# Patient Record
Sex: Male | Born: 1990 | Race: Black or African American | Hispanic: No | Marital: Single | Smoking: Never smoker
Health system: Southern US, Community
[De-identification: ages and names within clinical notes are randomized; demographics above are authoritative.]

## PROBLEM LIST (undated history)

## (undated) DIAGNOSIS — J302 Other seasonal allergic rhinitis: Secondary | ICD-10-CM

## (undated) DIAGNOSIS — I82409 Acute embolism and thrombosis of unspecified deep veins of unspecified lower extremity: Secondary | ICD-10-CM

## (undated) DIAGNOSIS — S93402A Sprain of unspecified ligament of left ankle, initial encounter: Secondary | ICD-10-CM

## (undated) HISTORY — DX: Acute embolism and thrombosis of unspecified deep veins of unspecified lower extremity: I82.409

## (undated) HISTORY — PX: APPENDECTOMY: SHX54

---

## 2012-01-17 ENCOUNTER — Emergency Department (INDEPENDENT_AMBULATORY_CARE_PROVIDER_SITE_OTHER)
Admission: EM | Admit: 2012-01-17 | Discharge: 2012-01-17 | Disposition: A | Discharge status: Home / Self Care | Payer: Self-pay | Source: Home / Self Care | Attending: Emergency Medicine | Admitting: Emergency Medicine

## 2012-01-17 ENCOUNTER — Emergency Department (INDEPENDENT_AMBULATORY_CARE_PROVIDER_SITE_OTHER): Payer: Self-pay

## 2012-01-17 ENCOUNTER — Ambulatory Visit: Payer: Self-pay

## 2012-01-17 ENCOUNTER — Encounter (HOSPITAL_COMMUNITY): Payer: Self-pay | Admitting: *Deleted

## 2012-01-17 DIAGNOSIS — S93409A Sprain of unspecified ligament of unspecified ankle, initial encounter: Secondary | ICD-10-CM

## 2012-01-17 DIAGNOSIS — S93402A Sprain of unspecified ligament of left ankle, initial encounter: Secondary | ICD-10-CM

## 2012-01-17 HISTORY — DX: Other seasonal allergic rhinitis: J30.2

## 2012-01-17 HISTORY — DX: Sprain of unspecified ligament of left ankle, initial encounter: S93.402A

## 2012-01-17 MED ORDER — IBUPROFEN 800 MG PO TABS: 800.0000 mg | ORAL_TABLET | Freq: Three times a day (TID) | ORAL | Status: AC | PRN

## 2012-01-17 MED ORDER — HYDROCODONE-ACETAMINOPHEN 5-325 MG PO TABS: 2.0000 | ORAL_TABLET | Freq: Four times a day (QID) | ORAL | Status: AC | PRN

## 2012-01-17 NOTE — ED Notes (Signed)
Pt  Reports  2  Days  Ago  He  Was   Playing   Basketball     X  2  Days  Ago     And  Injured   His  l  Ankle       He  Was   Jumping  And  Landed  Twisting his  l  Ankle  He  Heard a  Pop    -  He  Has  Pain and  Swelling  Evident    He  Is  Able  To bear  Weight     But  Has  Some  Pain when he  Does  So

## 2012-01-17 NOTE — ED Provider Notes (Signed)
History     CSN: [id number]  Arrival date & time 01/17/12  1604   First MD Initiated Contact with Patient 01/17/12 1608      Chief Complaint  Patient presents with  . Ankle Pain    (Consider location/radiation/quality/duration/timing/severity/associated sxs/prior treatment) HPI Comments: Patient states he rolled his left ankle out words while playing basketball several days ago. Heard a "pop", with immediate swelling. Patient's was able to walk on it immediately after, but reports significant pain with weightbearing. Now has swelling lateral aspect of ankle, and bruising underneath  lateral malleolus. Patient has history of repeated left ankle sprains. Has been taking ibuprofen, ice, elevation with significant improvement in swelling and pain.  ROS as noted in HPI. All other ROS negative.   Patient is a 21 y.o. male presenting with ankle pain. The history is provided by the patient. No language interpreter was used.  Ankle Pain  The incident occurred 2 days ago. [place]. The injury mechanism was a fall. The pain is present in the left ankle. The quality of the pain is described as aching. The pain has been constant since onset. Associated symptoms include inability to bear weight. Pertinent negatives include no numbness, no loss of motion, no muscle weakness, no loss of sensation and no tingling. He reports no foreign bodies present. The symptoms are aggravated by activity, bearing weight and palpation. He has tried NSAIDs and ice (800 mg ibuprofen) for the symptoms. The treatment provided significant relief.    Past Medical History  Diagnosis Date  . Left ankle sprain   . Seasonal allergies     History reviewed. No pertinent past surgical history.  History reviewed. No pertinent family history.  History  Substance Use Topics  . Smoking status: Never Smoker   . Smokeless tobacco: Not on file  . Alcohol Use: No      Review of Systems  Neurological:  Negative for tingling and numbness.    Allergies  Review of patient's allergies indicates no known allergies.  Home Medications   Current Outpatient Rx  Name Route Sig Dispense Refill  . HYDROCODONE-ACETAMINOPHEN 5-325 MG PO TABS Oral Take 2 tablets by mouth every 6 (six) hours as needed for pain. 20 tablet 0  . IBUPROFEN 800 MG PO TABS Oral Take 1 tablet (800 mg total) by mouth every 8 (eight) hours as needed for pain or fever. 20 tablet 0    BP 133/78  Pulse 55  Temp(Src) 98.4 F (36.9 C) (Oral)  Resp 17  SpO2 97%  Physical Exam  Nursing note and vitals reviewed. Constitutional: He is oriented to person, place, and time. He appears well-developed and well-nourished.  HENT:  Head: Normocephalic and atraumatic.  Eyes: Conjunctivae and EOM are normal.  Neck: Normal range of motion.  Cardiovascular: Normal rate.   Pulmonary/Chest: Effort normal. No respiratory distress.  Abdominal: He exhibits no distension.  Musculoskeletal: Normal range of motion.       Left ankle: tenderness. AITFL tenderness found.       Feet:       Soft tissue swelling, Bruising underneath lateral malleolus see drawing. Ankle Tenderness entire joint, Distal fibula NT, Medial malleolus NT ,  Deltoid ligament NT, Lateral ligaments tender, Achilles NT, Proximal fibula NT, Proximal 5th metatarsal NT, Midfoot NT, distal NVI with baseline sensation / motor to foot with CR<2 seconds.  Neurological: He is alert and oriented to person, place, and time.  Skin: Skin is warm and dry.  Psychiatric:  He has a normal mood and affect. His behavior is normal.    ED Course  Procedures (including critical care time)  Labs Reviewed - No data to display Dg Ankle Complete Left  01/17/2012  *RADIOLOGY REPORT*  Clinical Data: Left ankle injury.  LEFT ANKLE COMPLETE - 3+ VIEW  Comparison: None.  Findings: No acute fracture or dislocation identified.  Soft tissue swelling is seen, primarily overlying the lateral malleolus.   Ankle mortise shows normal alignment.  IMPRESSION: Lateral soft tissue swelling.  No acute fracture identified.  Original Report Authenticated By: Reola Calkins, M.D.     1. Ankle sprain, left, initial encounter       MDM  X-ray reviewed by myself. No fracture. Full report per radiologist. Advised continued ice, rest, elevation. Discussed w/ patient that this will take 4-6 weeks to fully heal. Applied ASO, crutches WBAT, instructed pt on ice, nsaid/ norco prn, and f/u with Dr. Rennis Chris ortho on call in 10 days if no improvement.   Luiz Blare, MD 01/17/12 [phone]

## 2012-01-17 NOTE — Discharge Instructions (Signed)
Take the medication as written. Take 1 gram of tylenol with the motrin up to 4 times a day as needed for pain and fever. This is an effective combination for pain. Take the hydrocodone/norco/percocet only for severe pain. Do not take the tylenol and hydrocodone/norco/percocet as they both have tylenol in them and too much can hurt your liver. Return if you get worse, have a  fever >100.4, or for any concerns.   Go to www.goodrx.com to look up your medications. This will give you a list of where you can find your prescriptions at the most affordable prices.

## 2012-10-07 IMAGING — CR DG ANKLE COMPLETE 3+V*L*
3 series · 3 of 3 positions shown · non-contrast
Comparison: None.

CLINICAL DATA: Left ankle injury.

LEFT ANKLE COMPLETE - 3+ VIEW

[view not recorded (1 of 3)]
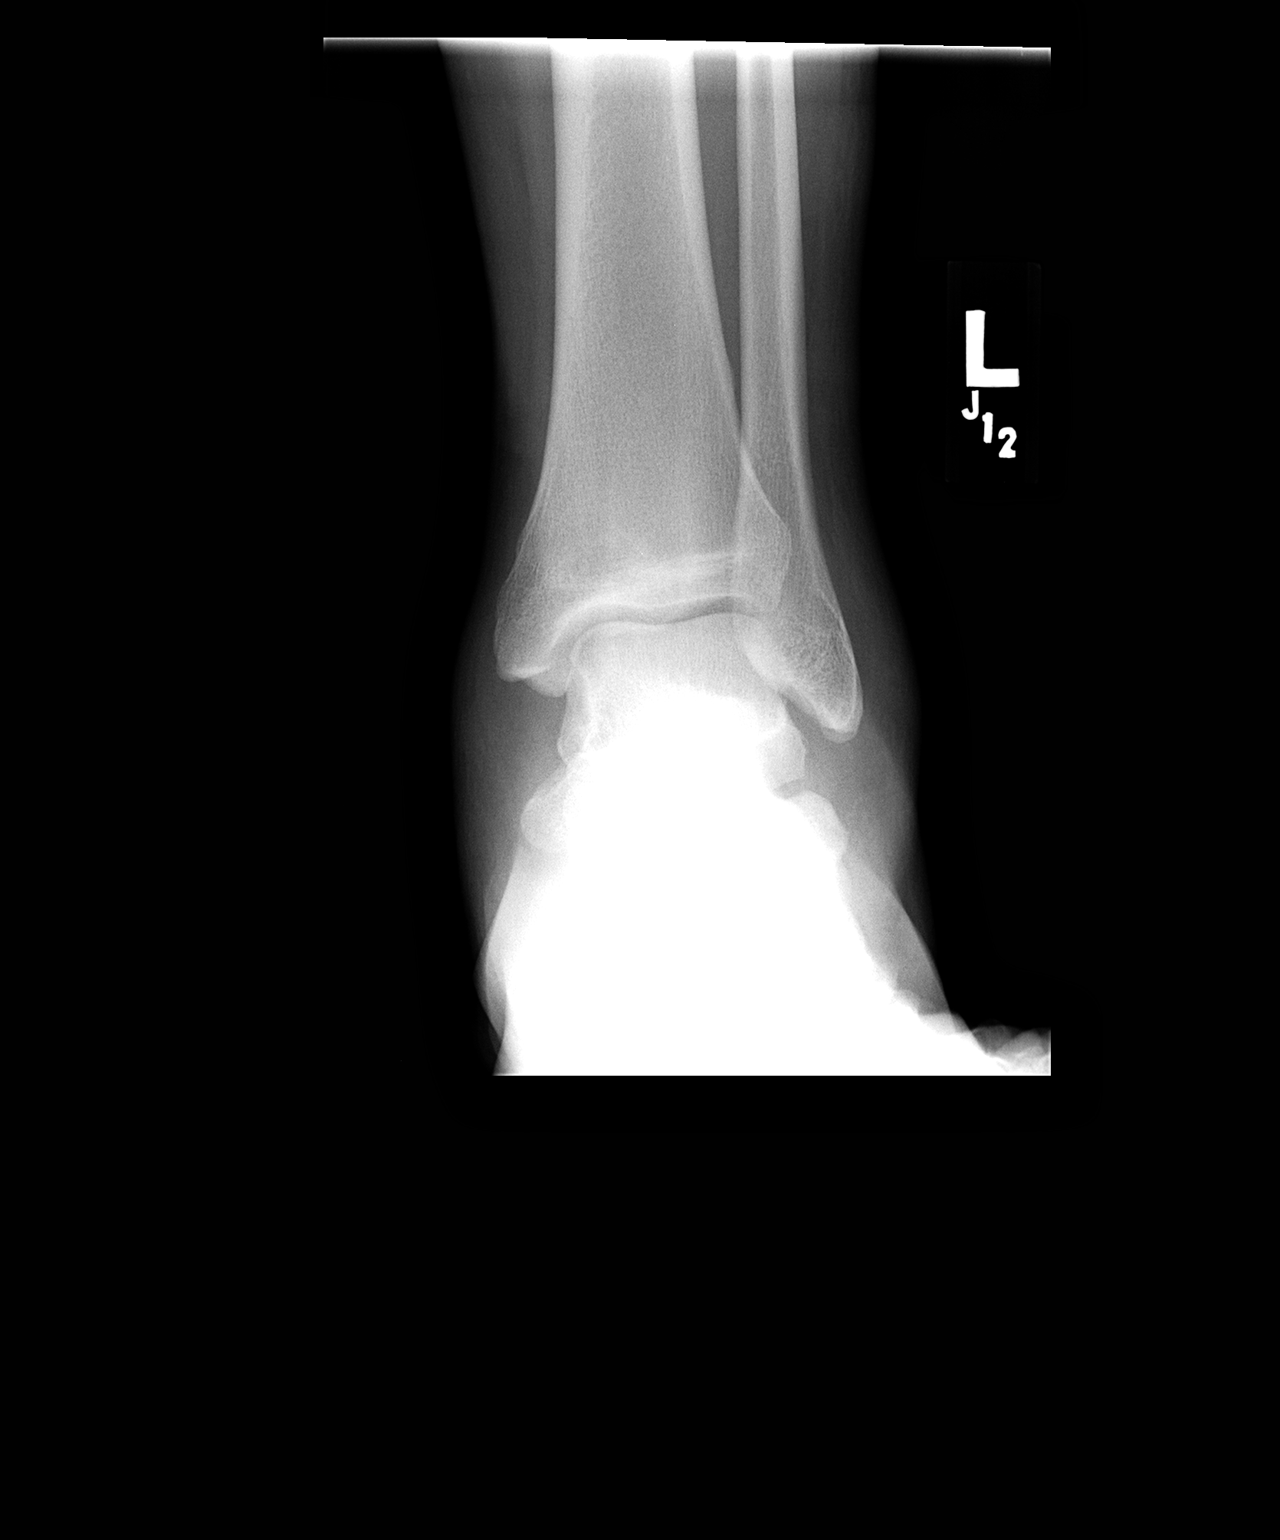

[view not recorded (2 of 3)]
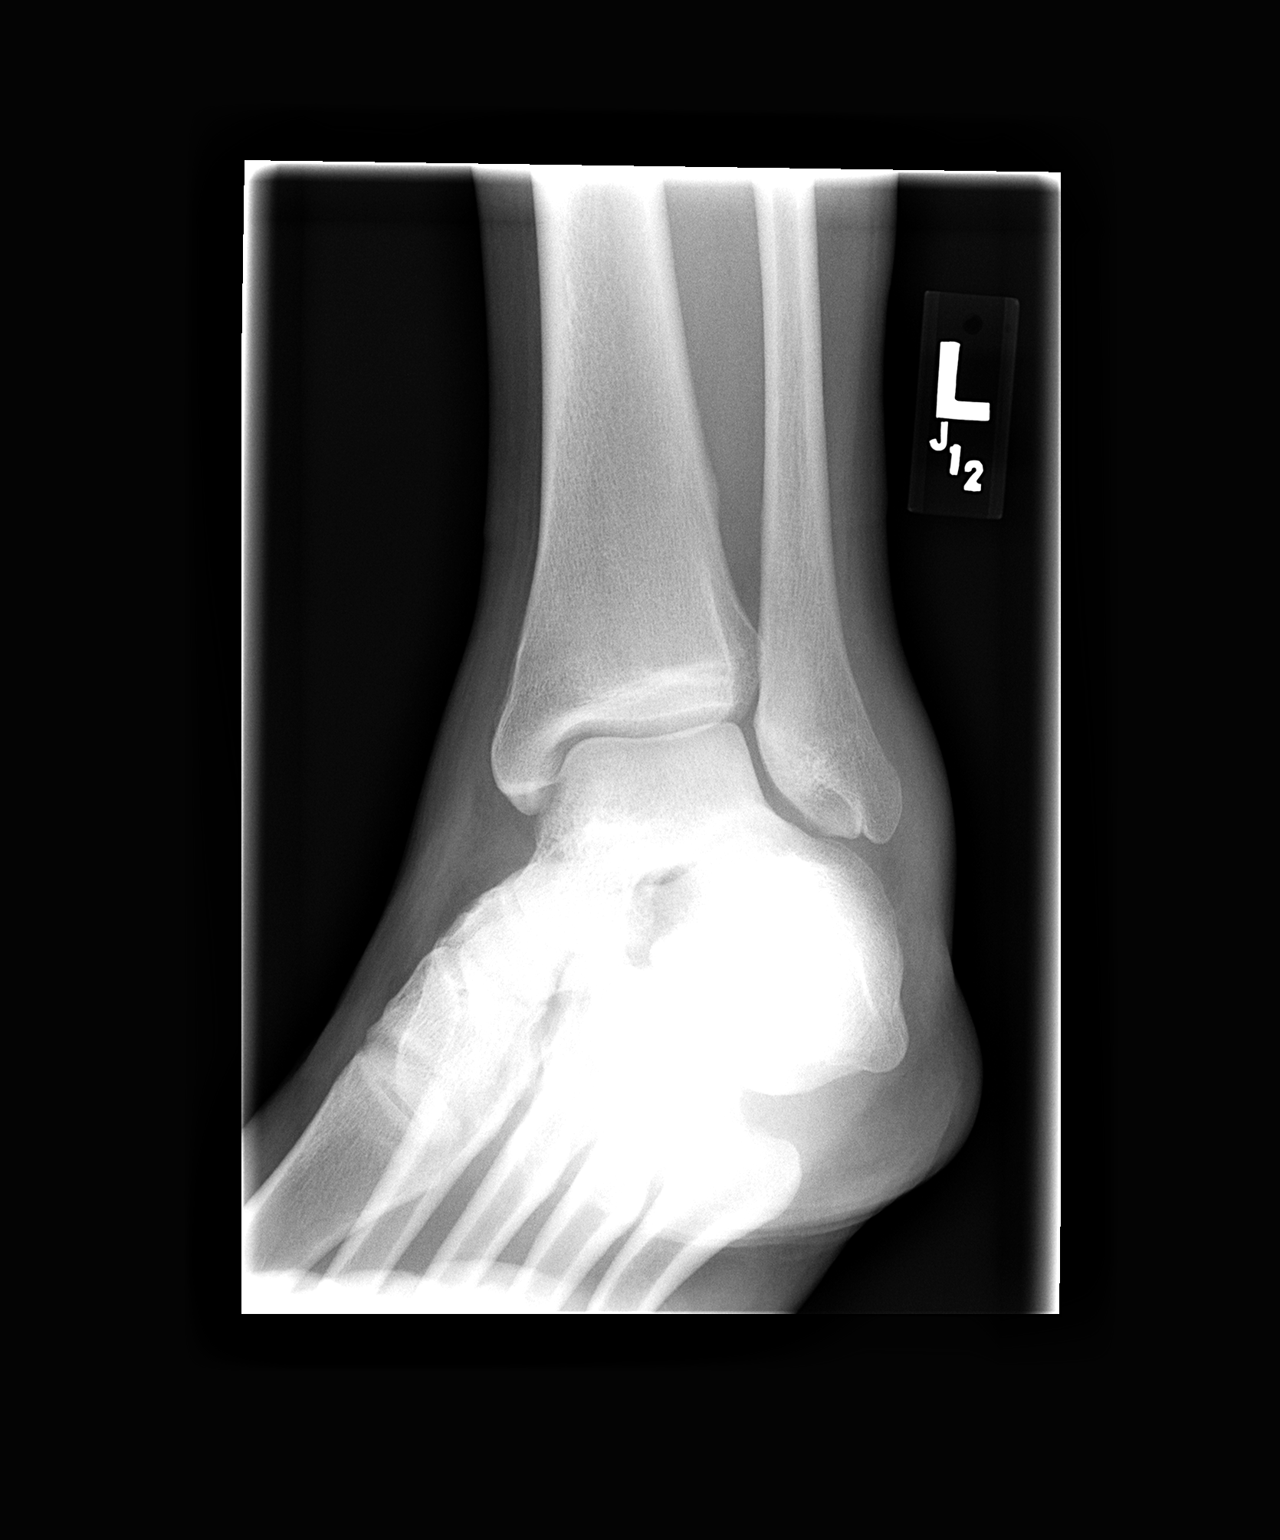

[view not recorded (3 of 3)]
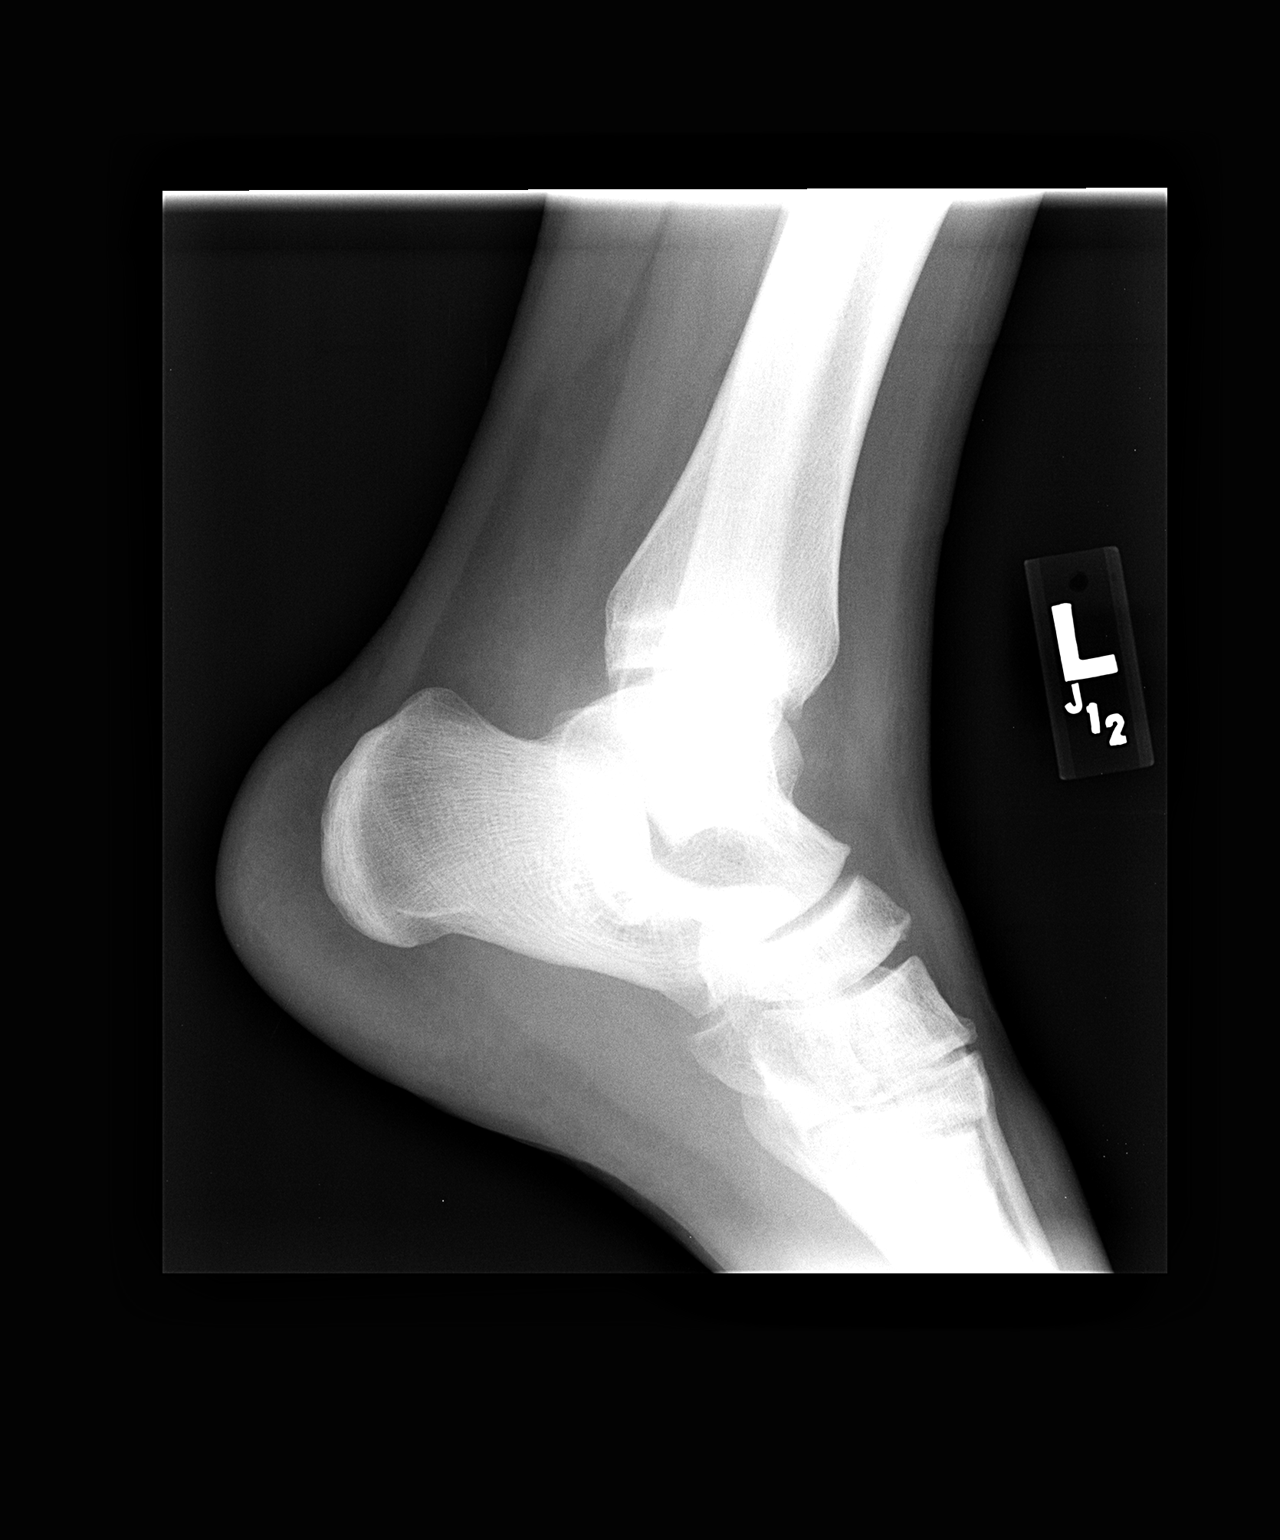

[3 of 3 positions shown; findings below may reference images not displayed]

FINDINGS: No acute fracture or dislocation identified.  Soft tissue
swelling is seen, primarily overlying the lateral malleolus.  Ankle
mortise shows normal alignment.
IMPRESSION: Lateral soft tissue swelling.  No acute fracture identified.

## 2020-05-05 ENCOUNTER — Other Ambulatory Visit: Payer: Self-pay

## 2020-05-05 DIAGNOSIS — Z20822 Contact with and (suspected) exposure to covid-19: Secondary | ICD-10-CM

## 2020-05-06 LAB — NOVEL CORONAVIRUS, NAA: SARS-CoV-2, NAA: NOT DETECTED

## 2020-05-06 LAB — SARS-COV-2, NAA 2 DAY TAT

## 2020-08-26 ENCOUNTER — Other Ambulatory Visit: Payer: Self-pay

## 2020-08-26 DIAGNOSIS — Z20822 Contact with and (suspected) exposure to covid-19: Secondary | ICD-10-CM

## 2020-08-28 LAB — SARS-COV-2, NAA 2 DAY TAT

## 2020-08-28 LAB — NOVEL CORONAVIRUS, NAA: SARS-CoV-2, NAA: NOT DETECTED

## 2020-08-28 LAB — SPECIMEN STATUS REPORT

## 2022-11-20 ENCOUNTER — Telehealth: Payer: Self-pay

## 2022-11-20 NOTE — Telephone Encounter (Signed)
Mychart msg sent

## 2024-04-03 ENCOUNTER — Other Ambulatory Visit (HOSPITAL_COMMUNITY): Payer: Self-pay

## 2024-04-03 ENCOUNTER — Other Ambulatory Visit: Payer: Self-pay

## 2024-04-03 ENCOUNTER — Emergency Department (HOSPITAL_COMMUNITY)
Admission: EM | Admit: 2024-04-03 | Discharge: 2024-04-03 | Disposition: A | Discharge status: Home / Self Care | Attending: Emergency Medicine | Admitting: Emergency Medicine

## 2024-04-03 ENCOUNTER — Emergency Department (HOSPITAL_COMMUNITY)

## 2024-04-03 DIAGNOSIS — M7989 Other specified soft tissue disorders: Secondary | ICD-10-CM | POA: Diagnosis not present

## 2024-04-03 DIAGNOSIS — I82462 Acute embolism and thrombosis of left calf muscular vein: Secondary | ICD-10-CM | POA: Diagnosis not present

## 2024-04-03 DIAGNOSIS — Z7901 Long term (current) use of anticoagulants: Secondary | ICD-10-CM | POA: Diagnosis not present

## 2024-04-03 DIAGNOSIS — M79662 Pain in left lower leg: Secondary | ICD-10-CM | POA: Diagnosis present

## 2024-04-03 DIAGNOSIS — I82402 Acute embolism and thrombosis of unspecified deep veins of left lower extremity: Secondary | ICD-10-CM | POA: Diagnosis not present

## 2024-04-03 LAB — I-STAT CHEM 8, ED
BUN: 15 mg/dL (ref 6–20)
Calcium, Ion: 1.15 mmol/L (ref 1.15–1.40)
Chloride: 104 mmol/L (ref 98–111)
Creatinine, Ser: 0.9 mg/dL (ref 0.61–1.24)
Glucose, Bld: 86 mg/dL (ref 70–99)
HCT: 42 % (ref 39.0–52.0)
Hemoglobin: 14.3 g/dL (ref 13.0–17.0)
Potassium: 3.9 mmol/L (ref 3.5–5.1)
Sodium: 138 mmol/L (ref 135–145)
TCO2: 24 mmol/L (ref 22–32)

## 2024-04-03 MED ORDER — APIXABAN (ELIQUIS) VTE STARTER PACK (10MG AND 5MG)
ORAL_TABLET | ORAL | 0 refills | Status: DC
  Filled 2024-04-03: qty 74, 30d supply, fill #0

## 2024-04-03 MED ORDER — APIXABAN 5 MG PO TABS: 5.0000 mg | ORAL_TABLET | Freq: Two times a day (BID) | ORAL | Status: DC

## 2024-04-03 MED ORDER — APIXABAN 5 MG PO TABS
10.0000 mg | ORAL_TABLET | Freq: Two times a day (BID) | ORAL | Status: DC
  Administered 2024-04-03: 10 mg via ORAL
  Filled 2024-04-03: qty 2

## 2024-04-03 NOTE — Discharge Instructions (Signed)
 Follow up with a family doc and the blood doctors in the office.   Please return for chest pain difficulty breathing or if you pass out or feel you are going to pass out.

## 2024-04-03 NOTE — ED Triage Notes (Signed)
 Pt. Stated, I stareted having pain in left leg especially behind the knee and the front of knee. Its to the point Im unable to walk on it.  No injuries

## 2024-04-03 NOTE — Progress Notes (Signed)
 LLE venous duplex has been completed.  Preliminary results given to Dr. Garrick.   Results can be found under chart review under CV PROC. 04/03/2024 12:07 PM Jada Kuhnert RVT, RDMS

## 2024-04-03 NOTE — ED Provider Notes (Signed)
 [place] EMERGENCY DEPARTMENT AT [facility] Provider Note   CSN: [id number] Arrival date & time: 04/03/24  9275     Patient presents with: Knee Pain   Alexander Lang is a 33 y.o. male.   33 yo M with a cc of left calf pain.  Going on for a few days now.  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  Denies injury to the area.  He denies chest pain difficulty breathing.  Denies history of blood clot in the past.  Denies recent surgery immobilization or hospitalization.   Knee Pain      Prior to Admission medications   Medication Sig Start Date End Date Taking? Authorizing Provider  APIXABAN  (ELIQUIS ) VTE STARTER PACK (10MG  AND 5MG ) Take as directed on package: start with two-5mg  tablets twice daily for 7 days. On day 8, switch to one-5mg  tablet twice daily. 04/03/24  Yes Emil Share, DO    Allergies: Sulfa antibiotics    Review of Systems  Updated Vital Signs BP 129/89 (BP Location: Right Arm)   Pulse 67   Temp 98.4 F (36.9 C)   Resp 17   Ht 6' 3 (1.905 m)   Wt 111.1 kg   SpO2 99%   BMI 30.62 kg/m   Physical Exam Vitals and nursing note reviewed.  Constitutional:      Appearance: He is well-developed.  HENT:     Head: Normocephalic and atraumatic.  Eyes:     Pupils: Pupils are equal, round, and reactive to light.  Neck:     Vascular: No JVD.  Cardiovascular:     Rate and Rhythm: Normal rate and regular rhythm.     Heart sounds: No murmur heard.    No friction rub. No gallop.  Pulmonary:     Effort: No respiratory distress.     Breath sounds: No wheezing.  Abdominal:     General: There is no distension.     Tenderness: There is no abdominal tenderness. There is no guarding or rebound.  Musculoskeletal:        General: Normal range of motion.     Cervical back: Normal range of motion and neck supple.     Comments: Pain and swelling to the left calf.  Pulse motor and sensation intact distally.  Skin:    Coloration:  Skin is not pale.     Findings: No rash.  Neurological:     Mental Status: He is alert and oriented to person, place, and time.  Psychiatric:        Behavior: Behavior normal.     (all labs ordered are listed, but only abnormal results are displayed) Labs Reviewed  I-STAT CHEM 8, ED    EKG: None  Radiology: VAS US  LOWER EXTREMITY VENOUS (DVT) (ONLY [facility] & [facility]) Result Date: 04/03/2024  Lower Venous DVT Study Patient Name:  Alexander Lang [facility]  Date of Exam:   04/03/2024 Medical Rec #: [id number]      Accession #:    [id number] Date of Birth: May 19, 1991     Patient Gender: M Patient Age:   89 years Exam Location:  [facility] Procedure:      VAS US  LOWER EXTREMITY VENOUS (DVT) Referring Phys: LAMAR LOCKWOOD --------------------------------------------------------------------------------  Indications: Pain, and Swelling.  Risk Factors: Frequent extended travel for work. Comparison Study: No previous exams Performing Technologist: Jody Hill RVT, RDMS  Examination Guidelines: A complete evaluation includes B-mode imaging, spectral Doppler, color Doppler, and  power Doppler as needed of all accessible portions of each vessel. Bilateral testing is considered an integral part of a complete examination. Limited examinations for reoccurring indications may be performed as noted. The reflux portion of the exam is performed with the patient in reverse Trendelenburg.  +-----+---------------+---------+-----------+----------+--------------+ RIGHTCompressibilityPhasicitySpontaneityPropertiesThrombus Aging +-----+---------------+---------+-----------+----------+--------------+ CFV  Full           Yes      Yes                                 +-----+---------------+---------+-----------+----------+--------------+   +---------+---------------+---------+-----------+----------+-----------------+ LEFT     CompressibilityPhasicitySpontaneityPropertiesThrombus Aging     +---------+---------------+---------+-----------+----------+-----------------+ CFV      Full           Yes      Yes                                    +---------+---------------+---------+-----------+----------+-----------------+ SFJ      Full                                                           +---------+---------------+---------+-----------+----------+-----------------+ FV Prox  Full           Yes      Yes                                    +---------+---------------+---------+-----------+----------+-----------------+ FV Mid   Full           Yes      Yes                                    +---------+---------------+---------+-----------+----------+-----------------+ FV DistalFull           Yes      Yes                                    +---------+---------------+---------+-----------+----------+-----------------+ PFV      Full                                                           +---------+---------------+---------+-----------+----------+-----------------+ POP      Partial        Yes      Yes                  Age Indeterminate +---------+---------------+---------+-----------+----------+-----------------+ PTV      Full                                                           +---------+---------------+---------+-----------+----------+-----------------+ PERO     None  No       No                   Age Indeterminate +---------+---------------+---------+-----------+----------+-----------------+ Gastroc  None           No       No         dilated   Acute             +---------+---------------+---------+-----------+----------+-----------------+   Summary: RIGHT: - No evidence of common femoral vein obstruction.   LEFT: Findings consistent with acute intramuscular thrombosis involving the left gastrocnemius veins. - Findings consistent with age indeterminate deep vein thrombosis involving the left popliteal vein, and left peroneal  veins.  - No cystic structure found in the popliteal fossa.  *See table(s) above for measurements and observations.    Preliminary      Procedures   Medications Ordered in the ED  apixaban  (ELIQUIS ) tablet 10 mg (has no administration in time range)    Followed by  apixaban  (ELIQUIS ) tablet 5 mg (has no administration in time range)                                    Medical Decision Making  33 yo M with a chief complaints of left calf pain.  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  This area anticoagulation is controversial but with signs of old DVT I will start him on anticoagulation.  Patient unfortunately has no primary care and has not been seen by doctor in some years.  Blood testing was performed to assess his renal function which has resulted and is normal.  Will start on Eliquis .  With him not having immediate follow-up will give information to follow-up with the hematologist in the clinic.  4:49 PM:  I have discussed the diagnosis/risks/treatment options with the patient.  Evaluation and diagnostic testing in the emergency department does not suggest an emergent condition requiring admission or immediate intervention beyond what has been performed at this time.  They will follow up with PCP, heme. We also discussed returning to the ED immediately if new or worsening sx occur. We discussed the sx which are most concerning (e.g., sudden worsening pain, fever, inability to tolerate by mouth, chest pain, sob, syncope) that necessitate immediate return. Medications administered to the patient during their visit and any new prescriptions provided to the patient are listed below.  Medications given during this visit Medications  apixaban  (ELIQUIS ) tablet 10 mg (has no administration in time range)    Followed by  apixaban  (ELIQUIS ) tablet 5 mg (has no administration in time range)     The patient appears reasonably screen  and/or stabilized for discharge and I doubt any other medical condition or other [facility] requiring further screening, evaluation, or treatment in the ED at this time prior to discharge.       Final diagnoses:  Acute deep vein thrombosis (DVT) of calf muscle vein of left lower extremity [facility])    ED Discharge Orders          Ordered    APIXABAN  (ELIQUIS ) VTE STARTER PACK (10MG  AND 5MG )       Note to Pharmacy: If starter pack unavailable, substitute with seventy-four 5 mg apixaban  tabs following the above SIG directions.   04/03/24 1635  Emil Share, DO 04/03/24 1649

## 2024-04-03 NOTE — ED Triage Notes (Signed)
 Complains of pain behind left knee with fluid build up.  Worse with standing and walking.  Reports left leg is more swollen than right.  Started a few days ago thinking it was a pulled muscle but has continued to worsen.

## 2024-05-01 ENCOUNTER — Telehealth: Payer: Self-pay | Admitting: Oncology

## 2024-05-01 NOTE — Telephone Encounter (Signed)
 Alexander Lang and stated that he has recently been in the hospital and was referred to see Dr. Autumn for DVT diagnosis. I do not see a referral in for [facility] from his most recent hospital visit.

## 2024-05-09 ENCOUNTER — Other Ambulatory Visit: Payer: Self-pay

## 2024-05-09 ENCOUNTER — Encounter: Payer: Self-pay | Admitting: Physician Assistant

## 2024-05-09 ENCOUNTER — Inpatient Hospital Stay: Attending: Physician Assistant | Admitting: Physician Assistant

## 2024-05-09 ENCOUNTER — Inpatient Hospital Stay

## 2024-05-09 VITALS — BP 136/89 | HR 60 | Temp 97.3°F | Resp 20 | Wt 243.7 lb

## 2024-05-09 DIAGNOSIS — I82432 Acute embolism and thrombosis of left popliteal vein: Secondary | ICD-10-CM | POA: Diagnosis not present

## 2024-05-09 DIAGNOSIS — Z7901 Long term (current) use of anticoagulants: Secondary | ICD-10-CM | POA: Insufficient documentation

## 2024-05-09 DIAGNOSIS — I82462 Acute embolism and thrombosis of left calf muscular vein: Secondary | ICD-10-CM

## 2024-05-09 DIAGNOSIS — Z86718 Personal history of other venous thrombosis and embolism: Secondary | ICD-10-CM | POA: Insufficient documentation

## 2024-05-09 DIAGNOSIS — R5383 Other fatigue: Secondary | ICD-10-CM

## 2024-05-09 DIAGNOSIS — I82452 Acute embolism and thrombosis of left peroneal vein: Secondary | ICD-10-CM | POA: Diagnosis not present

## 2024-05-09 LAB — CBC WITH DIFFERENTIAL (CANCER CENTER ONLY)
Abs Immature Granulocytes: 0.01 K/uL (ref 0.00–0.07)
Basophils Absolute: 0 K/uL (ref 0.0–0.1)
Basophils Relative: 1 %
Eosinophils Absolute: 0.1 K/uL (ref 0.0–0.5)
Eosinophils Relative: 2 %
HCT: 41 % (ref 39.0–52.0)
Hemoglobin: 13.7 g/dL (ref 13.0–17.0)
Immature Granulocytes: 0 %
Lymphocytes Relative: 32 %
Lymphs Abs: 1.6 K/uL (ref 0.7–4.0)
MCH: 30.1 pg (ref 26.0–34.0)
MCHC: 33.4 g/dL (ref 30.0–36.0)
MCV: 90.1 fL (ref 80.0–100.0)
Monocytes Absolute: 0.3 K/uL (ref 0.1–1.0)
Monocytes Relative: 7 %
Neutro Abs: 2.8 K/uL (ref 1.7–7.7)
Neutrophils Relative %: 58 %
Platelet Count: 252 K/uL (ref 150–400)
RBC: 4.55 MIL/uL (ref 4.22–5.81)
RDW: 13.2 % (ref 11.5–15.5)
WBC Count: 4.8 K/uL (ref 4.0–10.5)
nRBC: 0 % (ref 0.0–0.2)

## 2024-05-09 LAB — CMP (CANCER CENTER ONLY)
ALT: 28 U/L (ref 0–44)
AST: 23 U/L (ref 15–41)
Albumin: 4.5 g/dL (ref 3.5–5.0)
Alkaline Phosphatase: 78 U/L (ref 38–126)
Anion gap: 5 (ref 5–15)
BUN: 13 mg/dL (ref 6–20)
CO2: 27 mmol/L (ref 22–32)
Calcium: 9.4 mg/dL (ref 8.9–10.3)
Chloride: 107 mmol/L (ref 98–111)
Creatinine: 0.76 mg/dL (ref 0.61–1.24)
GFR, Estimated: >60 mL/min (ref >60)
Glucose, Bld: 93 mg/dL (ref 70–99)
Potassium: 4.1 mmol/L (ref 3.5–5.1)
Sodium: 139 mmol/L (ref 135–145)
Total Bilirubin: 0.5 mg/dL (ref 0.0–1.2)
Total Protein: 7.4 g/dL (ref 6.5–8.1)

## 2024-05-09 MED ORDER — APIXABAN 5 MG PO TABS: 5.0000 mg | ORAL_TABLET | Freq: Two times a day (BID) | ORAL | 3 refills | Status: AC

## 2024-05-09 NOTE — Progress Notes (Signed)
 [facility] [phone]   [phone]  INITIAL CONSULT NOTE  Patient Care Team: Patient, No Pcp Per as PCP - General (General Practice)  Hematological/Oncological History 04/03/2024: Presented to ED due to left calf pain x 4-5 days. Lower extremity US  confirmed acute DVT involving the gastrocnemius veins and age indeterminate DVT involving the left popliteal and peroneal veins. Started on Eliquis  starter pack. 05/09/2024: Establish care with [facility] Hematology  CHIEF COMPLAINTS/PURPOSE OF CONSULTATION:  Left lower extremity DVT   HISTORY OF PRESENTING ILLNESS:  Alexander Lang 33 y.o. male  presents to the clinic for recently diagnosed left lower extremity edema. He is unaccompanied for this visit.   On exam today, [name] reports that he is compliant with taking Eliquis  therapy but he ran out of his prescription to 3 days ago.  He reports that his swelling and pain on his left leg has nearly resolved.  He does wake up some days with stiffness which is improving.  He denies any signs of bleeding while on Eliquis  therapy including hematochezia or melena.  Patient denies any other signs of bleeding including leg pain, leg swelling, chest pain or shortness o he denies fevers, chills, night sweats, shortness of breath, chest pain or cough.  He has no other complaints.  Rest of the 10 point ROS as below is below.   MEDICAL HISTORY:  Past Medical History:  Diagnosis Date   DVT (deep venous thrombosis) (HCC)    Left ankle sprain    Seasonal allergies     SURGICAL HISTORY: Past Surgical History:  Procedure Laterality Date   APPENDECTOMY      SOCIAL HISTORY: Social History   Socioeconomic History   Marital status: Single    Spouse name: Not on file   Number of children: Not on file   Years of education: Not on file   Highest education level: Not on file  Occupational History   Not on file  Tobacco Use   Smoking status: Never   Smokeless tobacco:  Never  Substance and Sexual Activity   Alcohol use: No   Drug use: No   Sexual activity: Not on file  Other Topics Concern   Not on file  Social History Narrative   Not on file   Social Drivers of Health   Financial Resource Strain: Not on file  Food Insecurity: Food Insecurity Present (05/09/2024)   Hunger Vital Sign    Worried About Running Out of Food in the Last Year: Sometimes true    Ran Out of Food in the Last Year: Sometimes true  Transportation Needs: Unmet Transportation Needs (05/09/2024)   PRAPARE - Administrator, Civil Service (Medical): No    Lack of Transportation (Non-Medical): Yes  Physical Activity: Not on file  Stress: Not on file  Social Connections: Not on file  Intimate Partner Violence: Not At Risk (05/09/2024)   Humiliation, Afraid, Rape, and Kick questionnaire    Fear of Current or Ex-Partner: No    Emotionally Abused: No    Physically Abused: No    Sexually Abused: No    FAMILY HISTORY: History reviewed. No pertinent family history.  ALLERGIES:  is allergic to sulfa antibiotics.  MEDICATIONS:  Current Outpatient Medications  Medication Sig Dispense Refill   apixaban  (ELIQUIS ) 5 MG TABS tablet Take 1 tablet (5 mg total) by mouth 2 (two) times daily. 60 tablet 3   No current facility-administered medications for this visit.    REVIEW OF SYSTEMS:  Constitutional: ( - ) fevers, ( - )  chills , ( - ) night sweats Eyes: ( - ) blurriness of vision, ( - ) double vision, ( - ) watery eyes Ears, nose, mouth, throat, and face: ( - ) mucositis, ( - ) sore throat Respiratory: ( - ) cough, ( - ) dyspnea, ( - ) wheezes Cardiovascular: ( - ) palpitation, ( - ) chest discomfort, ( - ) lower extremity swelling Gastrointestinal:  ( - ) nausea, ( - ) heartburn, ( - ) change in bowel habits Skin: ( - ) abnormal skin rashes Lymphatics: ( - ) new lymphadenopathy, ( - ) easy bruising Neurological: ( - ) numbness, ( - ) tingling, ( - ) new  weaknesses Behavioral/Psych: ( - ) mood change, ( - ) new changes  All other systems were reviewed with the patient and are negative.  PHYSICAL EXAMINATION: ECOG PERFORMANCE STATUS: 0 - Asymptomatic  Vitals:   05/09/24 1335  BP: 136/89  Pulse: 60  Resp: 20  Temp: (!) 97.3 F (36.3 C)  SpO2: 98%   Filed Weights   05/09/24 1335  Weight: 243 lb 11.2 oz (110.5 kg)    GENERAL: well appearing male in NAD  SKIN: skin color, texture, turgor are normal, no rashes or significant lesions EYES: conjunctiva are pink and non-injected, sclera clear LUNGS: clear to auscultation and percussion with normal breathing effort HEART: regular rate & rhythm and no murmurs and no lower extremity edema Musculoskeletal: no cyanosis of digits and no clubbing  PSYCH: alert & oriented x 3, fluent speech NEURO: no focal motor/sensory deficits  LABORATORY DATA:  I have reviewed the data as listed    Latest Ref Rng & Units 05/09/2024    2:49 PM 04/03/2024    4:32 PM  CBC  WBC 4.0 - 10.5 K/uL 4.8    Hemoglobin 13.0 - 17.0 g/dL 86.2  85.6   Hematocrit 39.0 - 52.0 % 41.0  42.0   Platelets 150 - 400 K/uL 252         Latest Ref Rng & Units 05/09/2024    2:49 PM 04/03/2024    4:32 PM  CMP  Glucose 70 - 99 mg/dL 93  86   BUN 6 - 20 mg/dL 13  15   Creatinine 9.38 - 1.24 mg/dL 9.23  9.09   Sodium 864 - 145 mmol/L 139  138   Potassium 3.5 - 5.1 mmol/L 4.1  3.9   Chloride 98 - 111 mmol/L 107  104   CO2 22 - 32 mmol/L 27    Calcium 8.9 - 10.3 mg/dL 9.4    Total Protein 6.5 - 8.1 g/dL 7.4    Total Bilirubin 0.0 - 1.2 mg/dL 0.5    Alkaline Phos 38 - 126 U/L 78    AST 15 - 41 U/L 23    ALT 0 - 44 U/L 28      RADIOGRAPHIC STUDIES: I have personally reviewed the radiological images as listed and agreed with the findings in the report. No results found.  ASSESSMENT & PLAN [name] is a 33 y.o. male who presents to the clinic for evaluation of recently diagnosed left lower extremity DVT.   A  provoked venous thromboembolism (VTE) is one that has a clear inciting factor or event. Provoking factors include prolonged travel/immobility, surgery (particular abdominal or orthropedic), trauma,  and pregnancy/ estrogen containing birth control. This patient was reported to have prolonged travel with long car rides greater than 3 hours, which  would qualify as a transient provoking factor.   As such we would recommend 3-6 months of anticoagulation therapy with consideration of additional therapy if symptoms persist. The anticoagulation therapy of choice in this situation is Eliquis . The patient currently has a supply of this medication and is able to afford it without difficult.   #Provoked Left Lower Extremity DVT --findings at this time are consistent with a provoked VTE --will order baseline CMP and CBC to assure labs are adequate for DOAC therapy --recommend the patient continue eliquis  by 5mg  BID --patient denies any bleeding, bruising, or dark stools on this medication. It is well tolerated. No difficulties accessing/affording the medication --labs today to check CBC and CMP. Will check hypercoagulable workup due to young age to rule out clotting disorder.  --RTC in 8 weeks with strict return precautions for overt signs of bleeding.    Orders Placed This Encounter  Procedures   CBC with Differential (Cancer Center Only)    Standing Status:   Future    Number of Occurrences:   1    Expiration Date:   05/09/2025   CMP (Cancer Center only)    Standing Status:   Future    Number of Occurrences:   1    Expiration Date:   05/09/2025   Antithrombin III    Protein C activity   Protein C, total   Protein S activity   Protein S, total   Lupus anticoagulant panel   Beta-2 -glycoprotein i abs, IgG/M/A   Homocysteine, serum   Factor 5 leiden   Prothrombin gene mutation   Cardiolipin antibodies, IgG, IgM, IgA   Testosterone     Standing Status:   Future    Number of Occurrences:   1     Expiration Date:   05/09/2025    All questions were answered. The patient knows to call the clinic with any problems, questions or concerns.  I have spent a total of 60 minutes minutes of face-to-face and non-face-to-face time, preparing to see the patient, obtaining and/or reviewing separately obtained history, performing a medically appropriate examination, counseling and educating the patient, ordering medications/tests/procedures, referring and communicating with other health care professionals, documenting clinical information in the electronic health record, independently interpreting results and communicating results to the patient, and care coordination.   Johnston Police, PA-C Department of Hematology/Oncology [facility] at [facility] Phone: [phone]  Patient was seen with Dr. Federico  I have read the above note and personally examined the patient. I agree with the assessment and plan as noted above.  Briefly Mr. Celedonio Sortino is a 33 year old male who presents for evaluation of a newly diagnosed left lower extremity DVT.  At this time it appears to be a provoked VTE in the setting of frequent travel and immobility.  He makes long car rides up to [place] and flights.  He is a Tour manager.  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  He notes that he is tolerating his Eliquis  therapy well with no bleeding, bruising, or dark stools.  He has no personal history of VTE and no family history.  Given his young age I would recommend performing a full hypercoagulable workup in order to assure we are not missing an underlying coagulation disorder.  Additionally I would recommend we continue his Eliquis  for 3 to 6 months and if no clear underlying disorders found would recommend discontinuing at that time.  Fortunately his clot is low risk and below the knee.  The patient voiced understanding of our findings and  recommendation moving forward.   Norleen IVAR Kidney, MD Department of Hematology/Oncology [facility] at [facility] Phone: [phone] Pager: [phone] Email: [email or web address]

## 2024-05-10 LAB — BETA-2-GLYCOPROTEIN I ABS, IGG/M/A
Beta-2 Glyco I IgG: <9 GPI IgG units (ref 0–20)
Beta-2-Glycoprotein I IgA: <9 GPI IgA units (ref 0–25)
Beta-2-Glycoprotein I IgM: <9 GPI IgM units (ref 0–32)

## 2024-05-10 LAB — HOMOCYSTEINE: Homocysteine: 10.4 umol/L (ref 0.0–14.5)

## 2024-05-11 LAB — CARDIOLIPIN ANTIBODIES, IGG, IGM, IGA
Anticardiolipin IgA: <9 U/mL (ref 0–11)
Anticardiolipin IgG: <9 GPL U/mL (ref 0–14)
Anticardiolipin IgM: <9 [MPL'U]/mL (ref 0–12)

## 2024-05-11 LAB — PROTEIN C ACTIVITY: Protein C Activity: 106 % (ref 73–180)

## 2024-05-11 LAB — PROTEIN S, TOTAL: Protein S Ag, Total: 114 % (ref 60–150)

## 2024-05-11 LAB — PROTEIN S ACTIVITY: Protein S Activity: 102 % (ref 63–140)

## 2024-05-12 LAB — LUPUS ANTICOAGULANT PANEL
DRVVT: 42.6 s (ref 0.0–47.0)
PTT Lupus Anticoagulant: 29.7 s (ref 0.0–43.5)

## 2024-05-12 LAB — FACTOR 5 LEIDEN

## 2024-05-13 ENCOUNTER — Other Ambulatory Visit: Payer: Self-pay

## 2024-05-13 DIAGNOSIS — R5383 Other fatigue: Secondary | ICD-10-CM

## 2024-05-13 DIAGNOSIS — I82462 Acute embolism and thrombosis of left calf muscular vein: Secondary | ICD-10-CM

## 2024-05-13 LAB — PROTEIN C, TOTAL: Protein C, Total: 112 % (ref 60–150)

## 2024-05-13 LAB — TESTOSTERONE

## 2024-05-14 ENCOUNTER — Inpatient Hospital Stay

## 2024-05-14 DIAGNOSIS — Z7901 Long term (current) use of anticoagulants: Secondary | ICD-10-CM | POA: Diagnosis not present

## 2024-05-14 DIAGNOSIS — I82432 Acute embolism and thrombosis of left popliteal vein: Secondary | ICD-10-CM | POA: Diagnosis not present

## 2024-05-14 DIAGNOSIS — R5383 Other fatigue: Secondary | ICD-10-CM

## 2024-05-14 DIAGNOSIS — I82462 Acute embolism and thrombosis of left calf muscular vein: Secondary | ICD-10-CM

## 2024-05-14 DIAGNOSIS — I82452 Acute embolism and thrombosis of left peroneal vein: Secondary | ICD-10-CM | POA: Diagnosis not present

## 2024-05-14 LAB — ANTITHROMBIN III: AntiThromb III Func: 96 % (ref 75–120)

## 2024-05-14 LAB — PROTHROMBIN GENE MUTATION

## 2024-05-14 NOTE — Progress Notes (Unsigned)
 Testosterone  canceled: sample QNS per [facility].  Notified to [facility] at 1540 on 26Aug25 by [place].

## 2024-05-15 LAB — TESTOSTERONE: Testosterone: 569 ng/dL (ref 264–916)

## 2024-05-16 ENCOUNTER — Telehealth: Payer: Self-pay | Admitting: Physician Assistant

## 2024-05-16 NOTE — Telephone Encounter (Signed)
 I called Mr. Alexander Lang to review lab results from 05/09/2024. Findings showed no evidence of underlying clotting disorder. Proceed with Eliquis  therapy as prescribed and we will follow up in October to determine when to discontinue anticoagulation. Mr. Alexander Lang expressed understanding of the plan provided

## 2024-06-02 ENCOUNTER — Encounter: Payer: Self-pay | Admitting: Physician Assistant

## 2024-06-02 ENCOUNTER — Other Ambulatory Visit: Payer: Self-pay

## 2024-06-02 DIAGNOSIS — Z86718 Personal history of other venous thrombosis and embolism: Secondary | ICD-10-CM

## 2024-06-03 ENCOUNTER — Ambulatory Visit (HOSPITAL_COMMUNITY)
Admission: RE | Admit: 2024-06-03 | Discharge: 2024-06-03 | Disposition: A | Discharge status: Home / Self Care | Source: Ambulatory Visit | Attending: Physician Assistant | Admitting: Physician Assistant

## 2024-06-03 DIAGNOSIS — Z86718 Personal history of other venous thrombosis and embolism: Secondary | ICD-10-CM | POA: Insufficient documentation

## 2024-06-06 ENCOUNTER — Telehealth: Payer: Self-pay | Admitting: Physician Assistant

## 2024-06-06 NOTE — Telephone Encounter (Signed)
 I called and spoke to Alexander Lang regarding the venous ultrasound results. Findings show age indeterminate DVT in his right lower extremity. Discussed that since his right lower extremity was not examined when he was originally diagnosed, it is not considered a Eliquis  failure. Advised to continue Eliquis  at this time and monitor closely for any worsening leg pain or edema. Alexander Lang expressed understanding of the plan provided.

## 2024-07-03 ENCOUNTER — Inpatient Hospital Stay (HOSPITAL_BASED_OUTPATIENT_CLINIC_OR_DEPARTMENT_OTHER): Admitting: Hematology and Oncology

## 2024-07-03 ENCOUNTER — Other Ambulatory Visit: Payer: Self-pay | Admitting: Hematology and Oncology

## 2024-07-03 ENCOUNTER — Inpatient Hospital Stay: Attending: Physician Assistant

## 2024-07-03 VITALS — BP 122/80 | HR 71 | Temp 97.3°F | Resp 13 | Wt 248.7 lb

## 2024-07-03 DIAGNOSIS — I82432 Acute embolism and thrombosis of left popliteal vein: Secondary | ICD-10-CM | POA: Insufficient documentation

## 2024-07-03 DIAGNOSIS — I82462 Acute embolism and thrombosis of left calf muscular vein: Secondary | ICD-10-CM

## 2024-07-03 DIAGNOSIS — Z86718 Personal history of other venous thrombosis and embolism: Secondary | ICD-10-CM

## 2024-07-03 DIAGNOSIS — I82452 Acute embolism and thrombosis of left peroneal vein: Secondary | ICD-10-CM | POA: Diagnosis not present

## 2024-07-03 DIAGNOSIS — Z7901 Long term (current) use of anticoagulants: Secondary | ICD-10-CM | POA: Diagnosis not present

## 2024-07-03 LAB — CBC WITH DIFFERENTIAL (CANCER CENTER ONLY)
Abs Immature Granulocytes: 0.01 K/uL (ref 0.00–0.07)
Basophils Absolute: 0 K/uL (ref 0.0–0.1)
Basophils Relative: 1 %
Eosinophils Absolute: 0.1 K/uL (ref 0.0–0.5)
Eosinophils Relative: 2 %
HCT: 44.4 % (ref 39.0–52.0)
Hemoglobin: 15.1 g/dL (ref 13.0–17.0)
Immature Granulocytes: 0 %
Lymphocytes Relative: 33 %
Lymphs Abs: 1.8 K/uL (ref 0.7–4.0)
MCH: 30.5 pg (ref 26.0–34.0)
MCHC: 34 g/dL (ref 30.0–36.0)
MCV: 89.7 fL (ref 80.0–100.0)
Monocytes Absolute: 0.4 K/uL (ref 0.1–1.0)
Monocytes Relative: 6 %
Neutro Abs: 3.2 K/uL (ref 1.7–7.7)
Neutrophils Relative %: 58 %
Platelet Count: 314 K/uL (ref 150–400)
RBC: 4.95 MIL/uL (ref 4.22–5.81)
RDW: 12.9 % (ref 11.5–15.5)
WBC Count: 5.4 K/uL (ref 4.0–10.5)
nRBC: 0 % (ref 0.0–0.2)

## 2024-07-03 LAB — CMP (CANCER CENTER ONLY)
ALT: 76 U/L — ABNORMAL HIGH (ref 0–44)
AST: 39 U/L (ref 15–41)
Albumin: 4.5 g/dL (ref 3.5–5.0)
Alkaline Phosphatase: 79 U/L (ref 38–126)
Anion gap: 5 (ref 5–15)
BUN: 19 mg/dL (ref 6–20)
CO2: 29 mmol/L (ref 22–32)
Calcium: 10.2 mg/dL (ref 8.9–10.3)
Chloride: 104 mmol/L (ref 98–111)
Creatinine: 0.9 mg/dL (ref 0.61–1.24)
GFR, Estimated: >60 mL/min (ref >60)
Glucose, Bld: 87 mg/dL (ref 70–99)
Potassium: 4.1 mmol/L (ref 3.5–5.1)
Sodium: 138 mmol/L (ref 135–145)
Total Bilirubin: 0.4 mg/dL (ref 0.0–1.2)
Total Protein: 7.6 g/dL (ref 6.5–8.1)

## 2024-07-03 NOTE — Progress Notes (Signed)
 [facility] Telephone:([phone]) [phone]   [phone]  PROGRESS NOTE  Patient Care Team: Patient, No Pcp Per as PCP - General (General Practice)  Hematological/Oncological History # Left lower extremity DVT  04/03/2024: Presented to ED due to left calf pain x 4-5 days. Lower extremity US  confirmed acute DVT involving the gastrocnemius veins and age indeterminate DVT involving the left popliteal and peroneal veins. Started on Eliquis  starter pack. 05/09/2024: Establish care with [facility] Hematology  Interval History:  Alexander Lang 33 y.o. male with medical history significant for LLE DVT who presents for a follow up visit. The patient's last visit was on 05/09/2024 at which time he established care. In the interim since the last visit he has continued on his anticoagulation therapy with Eliquis .  On exam today Alexander Lang reports that he has been well overall in interim since our last visit.  He reports he developed some new pain in his right leg and underwent ultrasound which did show a chronic clot there as well.  He reports that at the time his original diagnosis his left leg was only 1 ultrasound.  He reports that he does have some occasional soreness when he goes to the gym but no other signs or symptoms concerning for recurrent VTE.  He reports has been wearing his compression socks and taking his Eliquis .  He is also been doing his best to take breaks during long journeys at least every 2 hours.  He has had no issues with nosebleeds, gum bleeding, or blood in the urine/stool.  Overall he feels well and has no questions concerns or complaints today.  A full 10 point ROS is otherwise negative.  Today we had a long discussion about the risks and benefits of treatment options moving forward.  Given that this was a provoked VTE I think would be reasonable to discontinue anticoagulation at this time.  Gave him strict precautions to return to us  if he develops new or worsening symptoms.   Additionally recommend precautions to prevent future VTE.  He voices understanding of our recommendations moving forward.  MEDICAL HISTORY:  Past Medical History:  Diagnosis Date   DVT (deep venous thrombosis) (HCC)    Left ankle sprain    Seasonal allergies     SURGICAL HISTORY: Past Surgical History:  Procedure Laterality Date   APPENDECTOMY      SOCIAL HISTORY: Social History   Socioeconomic History   Marital status: Single    Spouse name: Not on file   Number of children: Not on file   Years of education: Not on file   Highest education level: Not on file  Occupational History   Not on file  Tobacco Use   Smoking status: Never   Smokeless tobacco: Never  Substance and Sexual Activity   Alcohol use: No   Drug use: No   Sexual activity: Not on file  Other Topics Concern   Not on file  Social History Narrative   Not on file   Social Drivers of Health   Financial Resource Strain: Not on file  Food Insecurity: Food Insecurity Present (05/09/2024)   Hunger Vital Sign    Worried About Running Out of Food in the Last Year: Sometimes true    Ran Out of Food in the Last Year: Sometimes true  Transportation Needs: Unmet Transportation Needs (05/09/2024)   PRAPARE - Administrator, Civil Service (Medical): No    Lack of Transportation (Non-Medical): Yes  Physical Activity: Not on file  Stress: Not on file  Social Connections: Not on file  Intimate Partner Violence: Not At Risk (05/09/2024)   Humiliation, Afraid, Rape, and Kick questionnaire    Fear of Current or Ex-Partner: No    Emotionally Abused: No    Physically Abused: No    Sexually Abused: No    FAMILY HISTORY: No family history on file.  ALLERGIES:  is allergic to sulfa antibiotics.  MEDICATIONS:  Current Outpatient Medications  Medication Sig Dispense Refill   apixaban  (ELIQUIS ) 5 MG TABS tablet Take 1 tablet (5 mg total) by mouth 2 (two) times daily. 60 tablet 3   No current  facility-administered medications for this visit.    REVIEW OF SYSTEMS:   Constitutional: ( - ) fevers, ( - )  chills , ( - ) night sweats Eyes: ( - ) blurriness of vision, ( - ) double vision, ( - ) watery eyes Ears, nose, mouth, throat, and face: ( - ) mucositis, ( - ) sore throat Respiratory: ( - ) cough, ( - ) dyspnea, ( - ) wheezes Cardiovascular: ( - ) palpitation, ( - ) chest discomfort, ( - ) lower extremity swelling Gastrointestinal:  ( - ) nausea, ( - ) heartburn, ( - ) change in bowel habits Skin: ( - ) abnormal skin rashes Lymphatics: ( - ) new lymphadenopathy, ( - ) easy bruising Neurological: ( - ) numbness, ( - ) tingling, ( - ) new weaknesses Behavioral/Psych: ( - ) mood change, ( - ) new changes  All other systems were reviewed with the patient and are negative.  PHYSICAL EXAMINATION:  Vitals:   07/03/24 1524  BP: 122/80  Pulse: 71  Resp: 13  Temp: (!) 97.3 F (36.3 C)  SpO2: 100%   Filed Weights   07/03/24 1524  Weight: 248 lb 11.2 oz (112.8 kg)    GENERAL: well appear young African American male. alert, no distress and comfortable SKIN: skin color, texture, turgor are normal, no rashes or significant lesions EYES: conjunctiva are pink and non-injected, sclera clear LUNGS: clear to auscultation and percussion with normal breathing effort HEART: regular rate & rhythm and no murmurs and no lower extremity edema Musculoskeletal: no cyanosis of digits and no clubbing  PSYCH: alert & oriented x 3, fluent speech NEURO: no focal motor/sensory deficits  LABORATORY DATA:  I have reviewed the data as listed    Latest Ref Rng & Units 07/03/2024    3:03 PM 05/09/2024    2:49 PM 04/03/2024    4:32 PM  CBC  WBC 4.0 - 10.5 K/uL 5.4  4.8    Hemoglobin 13.0 - 17.0 g/dL 84.8  86.2  85.6   Hematocrit 39.0 - 52.0 % 44.4  41.0  42.0   Platelets 150 - 400 K/uL 314  252         Latest Ref Rng & Units 07/03/2024    3:03 PM 05/09/2024    2:49 PM 04/03/2024    4:32 PM   CMP  Glucose 70 - 99 mg/dL 87  93  86   BUN 6 - 20 mg/dL 19  13  15    Creatinine 0.61 - 1.24 mg/dL 9.09  9.23  9.09   Sodium 135 - 145 mmol/L 138  139  138   Potassium 3.5 - 5.1 mmol/L 4.1  4.1  3.9   Chloride 98 - 111 mmol/L 104  107  104   CO2 22 - 32 mmol/L 29  27    Calcium 8.9 -  10.3 mg/dL 89.7  9.4    Total Protein 6.5 - 8.1 g/dL 7.6  7.4    Total Bilirubin 0.0 - 1.2 mg/dL 0.4  0.5    Alkaline Phos 38 - 126 U/L 79  78    AST 15 - 41 U/L 39  23    ALT 0 - 44 U/L 76  28     RADIOGRAPHIC STUDIES: I have personally reviewed the radiological images as listed and agreed with the findings in the report. No results found.  ASSESSMENT & PLAN Alexander Lang is a 33 y.o. male who presents to the clinic for evaluation of recently diagnosed left lower extremity DVT.    A provoked venous thromboembolism (VTE) is one that has a clear inciting factor or event. Provoking factors include prolonged travel/immobility, surgery (particular abdominal or orthropedic), trauma,  and pregnancy/ estrogen containing birth control. This patient was reported to have prolonged travel with long car rides greater than 3 hours, which would qualify as a transient provoking factor.    As such we would recommend 3-6 months of anticoagulation therapy with consideration of additional therapy if symptoms persist. The anticoagulation therapy of choice in this situation is Eliquis . The patient currently has a supply of this medication and is able to afford it without difficult.    #Provoked Left Lower Extremity DVT # Right Lower Extremity DVT  --findings at this time are consistent with a provoked VTE --will order baseline CMP and CBC to assure labs are adequate for DOAC therapy --OK to discontinue anticoagulation in the setting of this being a provoked set of VTE's. --Strict return precautions for any signs or symptoms concerning for recurrent VTE.  Additionally provided patient with recommendations to prevent future  VTE. --patient denies any bleeding, bruising, or dark stools on this medication. It is well tolerated. No difficulties accessing/affording the medication --labs today show white blood cell 5.4, hemoglobin 15.1, MCV 89.7, platelets 314 --RTC PRN  No orders of the defined types were placed in this encounter.   All questions were answered. The patient knows to call the clinic with any problems, questions or concerns.  A total of more than 30 minutes were spent on this encounter with face-to-face time and non-face-to-face time, including preparing to see the patient, ordering tests and/or medications, counseling the patient and coordination of care as outlined above.   Norleen IVAR Kidney, MD Department of Hematology/Oncology [facility] at [facility] Phone: [phone] Pager: [phone] Email: [email or web address]  07/13/2024 7:08 PM

## 2024-10-14 ENCOUNTER — Emergency Department (HOSPITAL_BASED_OUTPATIENT_CLINIC_OR_DEPARTMENT_OTHER)

## 2024-10-14 ENCOUNTER — Emergency Department (HOSPITAL_BASED_OUTPATIENT_CLINIC_OR_DEPARTMENT_OTHER)
Admission: EM | Admit: 2024-10-14 | Discharge: 2024-10-14 | Disposition: A | Discharge status: Home / Self Care | Attending: Emergency Medicine | Admitting: Emergency Medicine

## 2024-10-14 ENCOUNTER — Other Ambulatory Visit: Payer: Self-pay

## 2024-10-14 ENCOUNTER — Emergency Department (HOSPITAL_BASED_OUTPATIENT_CLINIC_OR_DEPARTMENT_OTHER): Admitting: Radiology

## 2024-10-14 DIAGNOSIS — Z7901 Long term (current) use of anticoagulants: Secondary | ICD-10-CM | POA: Diagnosis not present

## 2024-10-14 DIAGNOSIS — M549 Dorsalgia, unspecified: Secondary | ICD-10-CM

## 2024-10-14 DIAGNOSIS — M5440 Lumbago with sciatica, unspecified side: Secondary | ICD-10-CM | POA: Diagnosis present

## 2024-10-14 LAB — BASIC METABOLIC PANEL WITH GFR
Anion gap: 11 (ref 5–15)
BUN: 18 mg/dL (ref 6–20)
CO2: 26 mmol/L (ref 22–32)
Calcium: 9.4 mg/dL (ref 8.9–10.3)
Chloride: 103 mmol/L (ref 98–111)
Creatinine, Ser: 1 mg/dL (ref 0.61–1.24)
GFR, Estimated: >60 mL/min
Glucose, Bld: 81 mg/dL (ref 70–99)
Potassium: 4 mmol/L (ref 3.5–5.1)
Sodium: 139 mmol/L (ref 135–145)

## 2024-10-14 LAB — URINALYSIS, ROUTINE W REFLEX MICROSCOPIC
Bilirubin Urine: NEGATIVE
Glucose, UA: NEGATIVE mg/dL
Hgb urine dipstick: NEGATIVE
Ketones, ur: NEGATIVE mg/dL
Leukocytes,Ua: NEGATIVE
Nitrite: NEGATIVE
Specific Gravity, Urine: 1.034 — ABNORMAL HIGH (ref 1.005–1.030)
pH: 7.5 (ref 5.0–8.0)

## 2024-10-14 LAB — CBC
HCT: 40.9 % (ref 39.0–52.0)
Hemoglobin: 13.5 g/dL (ref 13.0–17.0)
MCH: 30 pg (ref 26.0–34.0)
MCHC: 33 g/dL (ref 30.0–36.0)
MCV: 90.9 fL (ref 80.0–100.0)
Platelets: 273 10*3/uL (ref 150–400)
RBC: 4.5 MIL/uL (ref 4.22–5.81)
RDW: 12.9 % (ref 11.5–15.5)
WBC: 5.4 10*3/uL (ref 4.0–10.5)
nRBC: 0 % (ref 0.0–0.2)

## 2024-10-14 MED ORDER — KETOROLAC TROMETHAMINE 30 MG/ML IJ SOLN: 30.0000 mg | Freq: Once | INTRAMUSCULAR | Status: DC

## 2024-10-14 MED ORDER — KETOROLAC TROMETHAMINE 15 MG/ML IJ SOLN
15.0000 mg | Freq: Once | INTRAMUSCULAR | Status: AC
  Administered 2024-10-14: 15 mg via INTRAVENOUS
  Filled 2024-10-14: qty 1

## 2024-10-14 MED ORDER — IBUPROFEN 800 MG PO TABS
800.0000 mg | ORAL_TABLET | Freq: Three times a day (TID) | ORAL | 0 refills | Status: AC | PRN
  Filled 2024-10-14: qty 21, 7d supply, fill #0

## 2024-10-14 MED ORDER — METHOCARBAMOL 500 MG PO TABS
500.0000 mg | ORAL_TABLET | Freq: Four times a day (QID) | ORAL | 0 refills | Status: AC | PRN
  Filled 2024-10-14: qty 20, 5d supply, fill #0

## 2024-10-14 NOTE — Discharge Instructions (Signed)
 Thank you for visiting the Emergency Department today. It was a pleasure to be part of your healthcare team.   Your were seen today for back pain  As discussed, rest, utilize ice/heat, lidocaine patches, and Tylenol /ibuprofen  as needed for pain relief. You should take your medications as directed. If you have any questions about your medicines, please call your pharmacy or healthcare provider.  It is important to watch for warning signs such as worsening pain or any new/worsening symptoms. If any of these happen, return to the Emergency Department or call 911.  Thank you for trusting us  with your health.

## 2024-10-14 NOTE — ED Triage Notes (Addendum)
 Patient reports midline lower back pain that began yesterday. Denies injury. States this has happened before but went away on its own. Took muscle relaxer and ibuprofen  prior to arrival

## 2024-10-14 NOTE — ED Provider Notes (Signed)
 " [facility] EMERGENCY DEPARTMENT AT [facility] Provider Note   CSN: [id number] Arrival date & time: 10/14/24  1534     Patient presents with: Back Pain   Alexander Lang is a 34 y.o. male who presents with back pain, ongoing since yesterday. The pain is described as deep cramp, located in his lower back, and wax/wane in nature.  The patient states that the pain is worsened with movement/activity and improved with rest. The patient denies a specific traumatic event and denies any overuse or strain.  He states that he has experienced this pain before on/off for the last year or so and has difficulty pinpointing a cause.  There is no associated numbness, tingling, weakness, joint instability, deformity, or loss of function. He denies any bowel/bladder incontinance. The patient has tried home over-the-counter analgesia and prior prescribed muscle relaxers with moderate relief.  The patient states that he is concerned for possible kidney stone, however denies any urinary symptoms.  He denies any fevers, nausea, vomiting, or diarrhea.  The patient is in no acute distress.      Back Pain      Prior to Admission medications  Medication Sig Start Date End Date Taking? Authorizing Provider  ibuprofen  (ADVIL ) 800 MG tablet Take 1 tablet (800 mg total) by mouth every 8 (eight) hours as needed for up to 7 days for moderate pain (pain score 4-6). 10/14/24 10/22/24 Yes Latica Hohmann L, PA  methocarbamol  (ROBAXIN ) 500 MG tablet Take 1 tablet (500 mg total) by mouth every 6 (six) hours as needed for up to 7 days for muscle spasms. 10/14/24 10/21/24 Yes Tavarion Babington L, PA  apixaban  (ELIQUIS ) 5 MG TABS tablet Take 1 tablet (5 mg total) by mouth 2 (two) times daily. 05/09/24   Neomi Johnston DASEN, PA-C    Allergies: Sulfa antibiotics    Review of Systems  Musculoskeletal:  Positive for back pain.    Updated Vital Signs BP (!) 145/92 (BP Location: Right Arm)   Pulse 67   Temp 97.9 F (36.6 C)   Resp 17    SpO2 99%   Physical Exam Constitutional:      General: He is awake. He is not in acute distress.    Appearance: He is not toxic-appearing.  HENT:     Head: Normocephalic and atraumatic.     Right Ear: Hearing normal.     Left Ear: Hearing normal.     Nose: Nose normal.  Eyes:     Extraocular Movements: Extraocular movements intact.     Conjunctiva/sclera: Conjunctivae normal.     Pupils: Pupils are equal, round, and reactive to light.  Cardiovascular:     Rate and Rhythm: Normal rate and regular rhythm.     Pulses: Normal pulses.  Pulmonary:     Effort: Pulmonary effort is normal.  Abdominal:     General: Abdomen is flat. Bowel sounds are normal.     Palpations: Abdomen is soft.     Tenderness: There is no abdominal tenderness. There is right CVA tenderness and left CVA tenderness.     Comments: No abdominal tenderness TTP. Bilateral CVA tenderness. No guarding, or rebound. No hernia appreciated on exam. There is no ecchymosis or additional skin changes to the abdomen.   Musculoskeletal:     Cervical back: Normal range of motion.     Lumbar back: Tenderness present. No swelling, deformity or bony tenderness. Normal range of motion. Positive right straight leg raise test and positive left straight leg raise  test.     Comments: Bilateral lumbar paraspinal muscle tenderness to palpation.  Skin:    General: Skin is warm and dry.     Capillary Refill: Capillary refill takes less than 2 seconds.  Neurological:     General: No focal deficit present.     Mental Status: He is alert.  Psychiatric:        Mood and Affect: Mood normal.     (all labs ordered are listed, but only abnormal results are displayed) Labs Reviewed  URINALYSIS, ROUTINE W REFLEX MICROSCOPIC - Abnormal; Notable for the following components:      Result Value   Specific Gravity, Urine 1.034 (*)    Protein, ur TRACE (*)    All other components within normal limits  BASIC METABOLIC PANEL WITH GFR  CBC     EKG: None  Radiology: No results found.    Procedures   Medications Ordered in the ED  ketorolac  (TORADOL ) 15 MG/ML injection 15 mg (15 mg Intravenous Given 10/14/24 1852)                                 Medical Decision Making Amount and/or Complexity of Data Reviewed Labs: ordered. Decision-making details documented in ED Course. Radiology: ordered. Decision-making details documented in ED Course.  Risk Prescription drug management.   Patient presents to the ED for: back pain This involves an extensive number of treatment options  Differential diagnosis includes:  Traumatic MSK etiology Minor, MSK etiology Nephrolithiasis   Clinical Course as of 10/20/24 0124  Tue Oct 14, 2024  1929 Temp: 97.9 F (36.6 C) Afebrile, vital stable, patient no acute distress [ML]  1929 Basic metabolic panel No acute findings [ML]  1929 CBC No acute findings [ML]  1929 Urinalysis, Routine w reflex microscopic -Urine, Clean Catch(!) No acute findings [ML]  1929 CT Renal Stone Study Nonobstructive bilateral nephrolithiasis measuring up to 7 mm in the left lower pole. [ML]    Clinical Course User Index [ML] Willma Duwaine CROME, [place]    Data Reviewed / Actions Taken: Labs ordered/reviewed with my independent interpretation in ED course above. Imaging ordered/reviewed with my independent interpretation in ED course above. I agree with the radiologists interpretation.   Management / Treatments: Patient given Toradol  for symptomatic relief-well-tolerated Reevaluation of the patient after these medicines showed that the patient improved.   I have reviewed the patients home medicines and have made adjustments as needed  ED Course / Reassessments: Problem List:  33 year old male presented for lower back pain. Initial assessment included history, physical exam, and review of prior medical records. Based on the reassuring exam findings there was low clinical suspicion for fracture,  dislocation, or neurovascular injury.  Given patient's complaint of flank pain and history of kidney stones, imaging was completed and demonstrated a large nonobstructive renal calculi. The patient was treated conservatively with analgesia and supportive care, with no worsening of symptoms during ED course.  The patient was deemed appropriate for discharge with a diagnosis of likely musculoskeletal strain/sprain.  Pain could be resulting from large renal calculi however given that it it is nonobstructive in nature, no current infection, and unremarkable laboratory studies - I have a low clinical suspicion that this could be causing patient's discomfort. Discharge instructions including rest, activity modification, ice/heat as appropriate, and use of NSAIDs, acetaminophen , and muscle relaxers for pain control. Strict return precautions were provided for increasing pain, fever, numbness, weakness or  loss of function.     Disposition: Disposition: Discharge with close follow-up with PCP for further evaluation and care Rationale for disposition: stable for discharge The disposition plan and rationale were discussed with the patient at the bedside, all questions were addressed, and the patient demonstrated understanding.  This note was produced using [employer]. While I have reviewed and verified all clinical information, transcription errors may remain.      Final diagnoses:  Back pain, unspecified back location, unspecified back pain laterality, unspecified chronicity    ED Discharge Orders          Ordered    methocarbamol  (ROBAXIN ) 500 MG tablet  Every 6 hours PRN        10/14/24 2020    ibuprofen  (ADVIL ) 800 MG tablet  Every 8 hours PRN        10/14/24 2020               Carl Bleecker L, PA 10/20/24 0126    Kammerer, Rosana Farnell L, DO 10/20/24 [phone]  "

## 2024-10-14 NOTE — ED Notes (Signed)
 Pt declined to have repeat vitals checked prior to departure.

## 2024-10-15 ENCOUNTER — Other Ambulatory Visit: Payer: Self-pay

## 2024-10-15 ENCOUNTER — Other Ambulatory Visit (HOSPITAL_BASED_OUTPATIENT_CLINIC_OR_DEPARTMENT_OTHER): Payer: Self-pay

## 2024-10-21 ENCOUNTER — Other Ambulatory Visit (HOSPITAL_BASED_OUTPATIENT_CLINIC_OR_DEPARTMENT_OTHER): Payer: Self-pay
